# Patient Record
Sex: Female | Born: 1990 | Hispanic: Yes | Marital: Single | Smoking: Never smoker
Health system: Southern US, Community
[De-identification: ages and names within clinical notes are randomized; demographics above are authoritative.]

## PROBLEM LIST (undated history)

## (undated) DIAGNOSIS — E282 Polycystic ovarian syndrome: Secondary | ICD-10-CM

## (undated) HISTORY — DX: Polycystic ovarian syndrome: E28.2

---

## 2013-04-23 DIAGNOSIS — E282 Polycystic ovarian syndrome: Secondary | ICD-10-CM

## 2013-04-23 HISTORY — DX: Polycystic ovarian syndrome: E28.2

## 2015-10-13 ENCOUNTER — Ambulatory Visit (INDEPENDENT_AMBULATORY_CARE_PROVIDER_SITE_OTHER): Payer: BLUE CROSS/BLUE SHIELD | Admitting: Gynecology

## 2015-10-13 ENCOUNTER — Encounter: Payer: Self-pay | Admitting: Gynecology

## 2015-10-13 VITALS — BP 118/78 | Ht 62.0 in | Wt 162.0 lb

## 2015-10-13 DIAGNOSIS — N979 Female infertility, unspecified: Secondary | ICD-10-CM | POA: Diagnosis not present

## 2015-10-13 DIAGNOSIS — Z01419 Encounter for gynecological examination (general) (routine) without abnormal findings: Secondary | ICD-10-CM

## 2015-10-13 DIAGNOSIS — N912 Amenorrhea, unspecified: Secondary | ICD-10-CM

## 2015-10-13 LAB — PREGNANCY, URINE: Preg Test, Ur: NEGATIVE

## 2015-10-13 MED ORDER — MEDROXYPROGESTERONE ACETATE 10 MG PO TABS
10.0000 mg | ORAL_TABLET | Freq: Every day | ORAL | Status: AC
Start: 2015-10-13 — End: ?

## 2015-10-13 MED ORDER — DOXYCYCLINE HYCLATE 100 MG PO CAPS: 100.0000 mg | ORAL_CAPSULE | Freq: Two times a day (BID) | ORAL | Status: AC

## 2015-10-13 NOTE — Patient Instructions (Signed)
Espermograma (Semen Analysis Test) POR QU ME DEBO REALIZAR ESTE EXAMEN? El espermograma se [place] Islandsutiliza para Chief Operating Officercontrolar ciertos aspectos de la salud de los rganos reproductivos del hombre (testculos) y el sistema hormonal que cumple una funcin en la produccin de semen. El semen es una secrecin blanquecina que se expulsa desde el pene durante la fase final del orgasmo (eyaculacin). Est compuesto por lquidos y nutrientes de la prstata, las vesculas seminales y otras glndulas. Tambin contiene espermatozoides de los testculos. Un solo espermatozoide contiene una serie completa del cdigo gentico de un hombre (cromosomas). Este ARAMARK Corporationanlisis puede realizarse como parte de la prueba de esterilidad, que se efecta para ayudar a Chief Strategy Officerdeterminar las causas de la incapacidad de Uphamconcebir. Cuando el espermograma se realiza por esta causa, la prueba incluir la forma (morfologa), el tamao y el movimiento (movilidad) de los espermatozoides. En este anlisis tambin se puede incluir el examen de la capacidad de los espermatozoides de Customer service managerpenetrar en un vulo Camera operator(fertilizar) as Immunologistcomo la formacin del material gentico (ADN). Tambin puede realizarse un espermograma para determinar si una vasectoma realizada con anterioridad fue satisfactoria. Una vasectoma es un procedimiento que se realiza para hacer que un hombre sea estril de forma Cloverportpermanente. Consiste en la ligadura del conducto que recibe el semen del testculo, que se denomina conducto deferente. En una vasectoma, se impide que el semen atraviese el conducto deferente y el pene de modo que no se introduzca en la vagina durante la relacin sexual. QU TIPO DE MUESTRA SE TOMA? Para esta prueba, se extrae Lauris Poaguna muestra de semen. TENDR QUE RECOGER LAS MUESTRAS EN MI CASA? Se recolectar Lauris Poaguna muestra de semen al eyacular en un recipiente de vidrio o plstico estril provisto por el laboratorio. La prueba puede hacerse en el hogar, el consultorio del mdico o en el  laboratorio. Si la muestra se Engineer, productionrecolecta en el hogar, siga las instrucciones del mdico sobre cmo obtener la Chillicothemuestra. Quenton FetterLa muestra debe entregarse al laboratorio en el lapso de 1 hora despus de la recoleccin. Tambin debe protegerse del calor o el fro extremos. CMO DEBO PREPARARME PARA ESTA PRUEBA? Para la prueba de esterilidad: Evite la actividad sexual durante 2 a 3 das antes de la recoleccin de la Parrishmuestra de semen. Sin embargo, no evite Psychiatristla eyaculacin durante un perodo prolongado porque esto puede Special educational needs teacheralterar la movilidad de los espermatozoides. Para la prueba en la que se determina si la vasectoma fue satisfactoria: Asegrese de Civil Service fast streamereyacular una o dos veces antes del da de recoleccin de la Hyrummuestra de semen. Esto despejar el conducto deferente del semen que estuvo presente antes de la vasectoma. QU SON LOS VALORES DE REFERENCIA? Los valores de referencia son los valores saludables establecidos despus de Education officer, environmentalrealizar el anlisis a un grupo grande de personas sanas. Pueden variar American Family Insuranceentre diferentes personas, laboratorios y hospitales. Es su responsabilidad retirar el resultado del McLouthestudio. Consulte en el laboratorio o en el departamento en el que fue realizado el estudio cundo y cmo podr Starbucks Corporationobtener los resultados.   Volumen: de 2 a 5 ml.  Tiempo de licuefaccin: de 20 a 30 minutos despus de Financial traderla recoleccin.  Aspecto: normal (color blanquecino).  Movilidad/ml: mayor o igual que [id number] (10millones).  Semen/ml: mayor o igual que [id number] (20millones).  Viscosidad: mayor o igual que 3.  Aglutinacin: mayor o igual que 3.  Supravital: mayor o igual que el 75% vivos.  Fructosa: positivo.  pH: de 7,12 a 8.  Recuento de espermatozoides (densidad): mayor o igual que 2920millones/ml.  Movilidad espermtica: mayor o igual  que el 50% en Georgianne Fick.  Morfologa de los espermatozoides: mayor que el 30% (criterio de [place] mayor que el 14%) de formas normales. QU SIGNIFICAN LOS  RESULTADOS? El bajo nmero de espermatozoides, su movilidad o su morfologa anormales pueden causar problemas de esterilidad masculina. Cuando el espermograma se realiza para determinar si la vasectoma fue satisfactoria, la presencia de semen puede indicar que la ciruga no fue exitosa. El mdico puede sugerir que se repita la prueba. Hable con el mdico [employer], las opciones de tratamiento y, si es necesario, la necesidad de Education officer, environmental ms [place]. Hable con el mdico si tiene [employer].   Esta informacin no tiene Theme park manager el consejo del mdico. Asegrese de hacerle al mdico cualquier pregunta que tenga.   Document Released: 01/28/2013 Document Revised: 04/30/2014 Elsevier Interactive Patient Education 2016 [employer]. Histerosalpingografa (Hysterosalpingography) La histerosalpingografa es un procedimiento para observar el interior del tero y las trompas de [place]. Durante este procedimiento, se inyecta una sustancia de contraste en el tero, a travs de la vagina y el cuello del tero para poder visualizar el tero mientras se toman imgenes radiogrficas. Este procedimiento puede ayudar al mdico a diagnosticar tumores, adherencias o anormalidades estructurales en el tero. Generalmente, se [place] para determinar las razones por las que una mujer no puede tener hijos (infertilidad). El procedimiento suele durar entre 15 y 30 minutos. INFORME A SU MDICO:  Cualquier alergia que tenga.  Todos los [employer], incluidos vitaminas, hierbas, gotas oftlmicas, cremas y [address] de [address].  Problemas previos que usted o los [employer] de su familia hayan tenido con el uso de anestsicos.  Enfermedades de [employer].  Cirugas previas.  Afecciones mdicas que tenga. RIESGOS Y COMPLICACIONES  En general, se trata de un procedimiento seguro. Sin embargo, Photographer, pueden surgir problemas. Estos  son algunos posibles problemas:  Infeccin en el revestimiento interior del tero (endometritis) o en las trompas de [place] (salpingitis).  Dao o perforacin del tero o las trompas de [place].  Reaccin alrgica a la sustancia de [place] para tomar la radiografa. ANTES DEL PROCEDIMIENTO   Debe planificar el procedimiento para despus que finalice su perodo, pero antes de su prxima ovulacin. Esto ocurre por lo general [employer] 5 y 10 de su ltimo perodo. El Da 1 es Film/video editor de su perodo.  Consulte a su mdico si debe cambiar o suspender los medicamentos que toma habitualmente.  Podr comer y beber normalmente.  Antes de comenzar el procedimiento, vace la vejiga. PROCEDIMIENTO  Es posible que le administren un medicamento para Lexicographer (sedante) o un analgsico de venta libre para Paramedic las molestias durante el procedimiento.  Deber acostarse en una mesa de radiografas con los pies en los estribos.  Le colocarn en la vagina un dispositivo llamado espculo. Esto permite al mdico observar el interior de la vagina hasta el cuello del tero.  El cuello del tero se lava con un jabn especial.  Luego se pasa un tubo delgado y flexible a travs del cuello del tero hasta el tero.  Por el tubo se colocar una sustancia de [place].  Le tomarn varias radiografas a medida que el contraste pasa a travs del tero y las trompas de [place].  Despus del procedimiento se retira el tubo. DESPUS DEL PROCEDIMIENTO   La mayor parte de la sustancia de contraste se eliminar de la vagina naturalmente. Puede ser necesario que use un apsito sanitario.  Puede sentir clicos leves y notar una hemorragia vaginal leve. Luego de 24 horas deben desaparecer.  Pregunte cundo Hughes Supplyestarn listos los resultados. Asegrese de Starbucks Corporationobtener los resultados.   Esta informacin no tiene Theme park managercomo fin reemplazar el consejo del mdico. Asegrese de hacerle al mdico cualquier  pregunta que tenga.   Document Released: 07/02/2011 Document Revised: 04/14/2013 Elsevier Interactive Patient Education [employer]2016 Elsevier Inc.

## 2015-10-13 NOTE — Progress Notes (Signed)
Krystal OrganMireida Murphy 1991-02-08 [id number]   History:    25 y.o.  for annual gyn exam who is new to the practice. Patient is here not only for her annual exam but also with complaining of amenorrhea and primary infertility. She stated in [place] years ago she was placed on metformin for which she takes 500 mg 4 times a day. She denies any history of knowing that she has diabetes. She denied any visual disturbances, she denied any nipple discharge or any unusual headaches. She does weigh 162 pounds and her BMI is 29.63 kg/m. She states that she recently menarche at age 89. She denies any past history of any STDs or any pelvic surgery. Her partner has never had any children with any one else. They have been intimate and unable to get pregnant for 4 years. She states that sometimes she will go between 4-6 months without having a menstrual cycle. She reports her last menstrual period cycle March of this year.  Past medical history,surgical history, family history and social history were all reviewed and documented in the EPIC chart.  Gynecologic History Patient's last menstrual period was 06/29/2015. Contraception: none Last Pap: Several years ago. Results were: Patient reports it was normal  Last mammogram: Not indicated. Results were: Not indicated  Obstetric History OB History  Gravida Para Term Preterm AB SAB TAB Ectopic Multiple Living  0 0 0 0 0 0 0 0 0 0          ROS: A ROS was performed and pertinent positives and negatives are included in the history.  GENERAL: No fevers or chills. HEENT: No change in vision, no earache, sore throat or sinus congestion. NECK: No pain or stiffness. CARDIOVASCULAR: No chest pain or pressure. No palpitations. PULMONARY: No shortness of breath, cough or wheeze. GASTROINTESTINAL: No abdominal pain, nausea, vomiting or diarrhea, melena or bright red blood per rectum. GENITOURINARY: No urinary frequency, urgency, hesitancy or dysuria.  MUSCULOSKELETAL: No joint or muscle pain, no back pain, no recent trauma. DERMATOLOGIC: No rash, no itching, no lesions. ENDOCRINE: No polyuria, polydipsia, no heat or cold intolerance. No recent change in weight. HEMATOLOGICAL: No anemia or easy bruising or bleeding. NEUROLOGIC: No headache, seizures, numbness, tingling or weakness. PSYCHIATRIC: No depression, no loss of interest in normal activity or change in sleep pattern.     Exam: chaperone present  BP 118/78 mmHg  Ht 5\' 2"  (1.575 m)  Wt 162 lb (73.483 kg)  BMI 29.62 kg/m2  LMP 06/29/2015  Body mass index is 29.62 kg/(m^2).  General appearance : Well developed well nourished female. No acute distress HEENT: Eyes: no retinal hemorrhage or exudates,  Neck supple, trachea midline, no carotid bruits, no thyroidmegaly Lungs: Clear to auscultation, no rhonchi or wheezes, or rib retractions  Heart: Regular rate and rhythm, no murmurs or gallops Breast:Examined in sitting and supine position were symmetrical in appearance, no palpable masses or tenderness,  no skin retraction, no nipple inversion, no nipple discharge, no skin discoloration, no axillary or supraclavicular lymphadenopathy Abdomen: no palpable masses or tenderness, no rebound or guarding Extremities: no edema or skin discoloration or tenderness  Pelvic:  Bartholin, Urethra, Skene Glands: Within normal limits             Vagina: No gross lesions or discharge  Cervix: No gross lesions or discharge  Uterus  anteverted, normal size, shape and consistency, non-tender and mobile  Adnexa  Without masses or tenderness  Anus and perineum  normal   Rectovaginal  normal sphincter tone without palpated masses or tenderness             Hemoccult not indicated     Assessment/Plan:  25 y.o. female for annual exam which was normal. Patient with primary infertility/secondary amenorrhea. Urine (test negative today. As part of her evaluation we are going to check a TSH and prolactin today  along with the following screening blood work: Comprehensive metabolic panel, fasting lipid profile, CBC, and urinalysis. Pap smear with HPV screening was done today. Patient has never received the HPV vaccine series. She has she is attempting to get pregnant we will need to hold off on the vaccine for now. If the TSH and prolactin are normal patient will take Provera 10 mg 1 by mouth daily for 10 days to jump start her menses. She will then contact the office at that we can schedule an HSG as well as a semen analysis for her partner. When she has a day for the HSG she is going to be prescribed Vibramycin 100 mg to take 1 by mouth twice a day for 3 days starting the day before the HSG. All the above was written by and instructions provided to the patient. She was instructed to begin taking prenatal vitamins daily and we'll reassess if she needs to continue on the metformin base of the above tests.  15  additional minutes beside annual exam was undertaken to discuss fertility and testing.  Ok EdwardsFERNANDEZ,JUAN H MD, 11:25 AM 10/13/2015

## 2015-10-14 LAB — URINALYSIS W MICROSCOPIC + REFLEX CULTURE
Bilirubin Urine: NEGATIVE
CASTS: NONE SEEN [LPF]
CRYSTALS: NONE SEEN [HPF]
Glucose, UA: NEGATIVE
HGB URINE DIPSTICK: NEGATIVE
Ketones, ur: NEGATIVE
Leukocytes, UA: NEGATIVE
NITRITE: NEGATIVE
PROTEIN: NEGATIVE
Specific Gravity, Urine: 1.025 (ref 1.001–1.035)
WBC, UA: NONE SEEN WBC/HPF (ref <=5)
YEAST: NONE SEEN [HPF]
pH: 5.5 (ref 5.0–8.0)

## 2015-10-14 LAB — PAP IG W/ RFLX HPV ASCU

## 2015-10-15 LAB — URINE CULTURE: Colony Count: 40000

## 2015-11-02 ENCOUNTER — Telehealth: Payer: Self-pay | Admitting: *Deleted

## 2015-11-02 DIAGNOSIS — N979 Female infertility, unspecified: Secondary | ICD-10-CM

## 2015-11-02 NOTE — Telephone Encounter (Signed)
-----   Message from Jerilynn Mageslaudia Shaffer sent at 11/01/2015 12:44 PM EDT ----- Regarding: hsg Please schedule hsg for JF pt. LMP 11-01-15 and she prefers mornings.  Thx

## 2015-11-02 NOTE — Telephone Encounter (Signed)
[facility]  Pt aware to start doxycycline 100 mg day before procedure. No sex prior to procedure as well.

## 2015-11-07 ENCOUNTER — Ambulatory Visit (HOSPITAL_COMMUNITY)
Admission: RE | Admit: 2015-11-07 | Discharge: 2015-11-07 | Disposition: A | Discharge status: Home / Self Care | Payer: BLUE CROSS/BLUE SHIELD | Source: Ambulatory Visit | Attending: Gynecology | Admitting: Gynecology

## 2015-11-07 DIAGNOSIS — R938 Abnormal findings on diagnostic imaging of other specified body structures: Secondary | ICD-10-CM | POA: Diagnosis not present

## 2015-11-07 DIAGNOSIS — N979 Female infertility, unspecified: Secondary | ICD-10-CM | POA: Diagnosis present

## 2015-11-07 MED ORDER — IOPAMIDOL (ISOVUE-300) INJECTION 61%
30.0000 mL | Freq: Once | INTRAVENOUS | Status: AC | PRN
  Administered 2015-11-07: 30 mL

## 2016-09-05 ENCOUNTER — Encounter: Payer: Self-pay | Admitting: Gynecology

## 2017-11-27 IMAGING — RF DG HYSTEROGRAM
6 series · 6 of 6 positions shown · IV contrast (omnipaque)
Comparison: None.

CLINICAL DATA: Primary infertility.

EXAM:
HYSTEROSALPINGOGRAM
TECHNIQUE: Following cleansing of the cervix and vagina with Betadine solution,
a hysterosalpingogram was performed using a 5-French
hysterosalpingogram catheter and Omnipaque 300 contrast. The patient
tolerated the examination without difficulty.

[Series 1: run · 1 of 1 slices shown (1 of 6)]
[im 1/1]
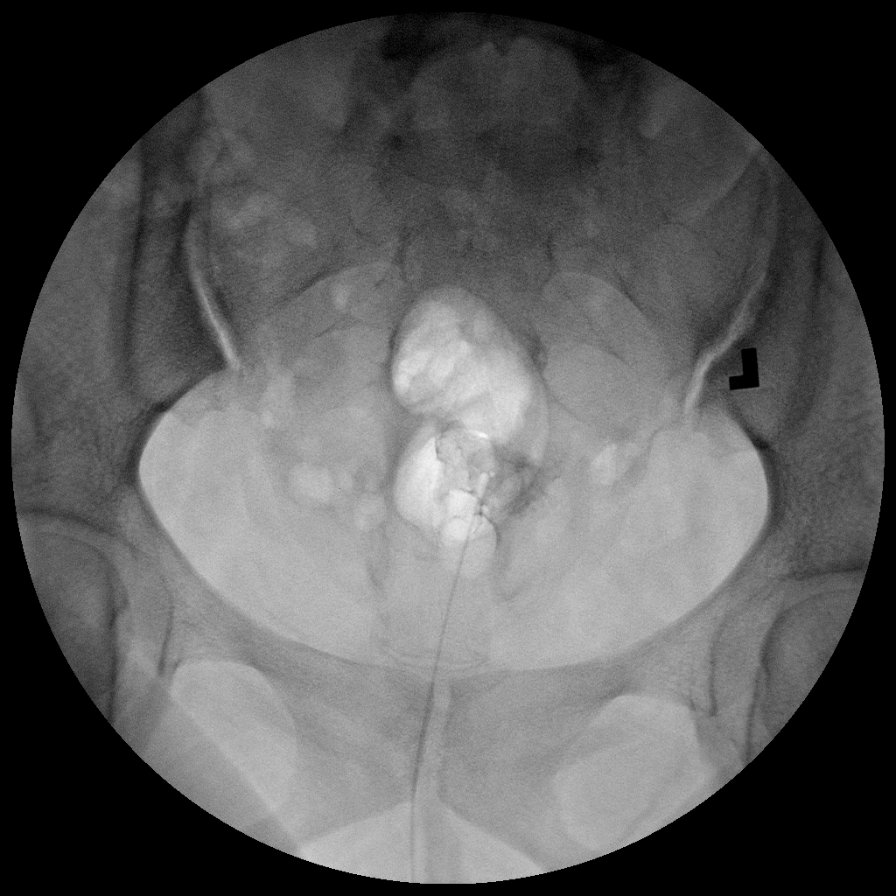

[Series 2: run · 1 of 1 slices shown (2 of 6)]
[im 1/1]
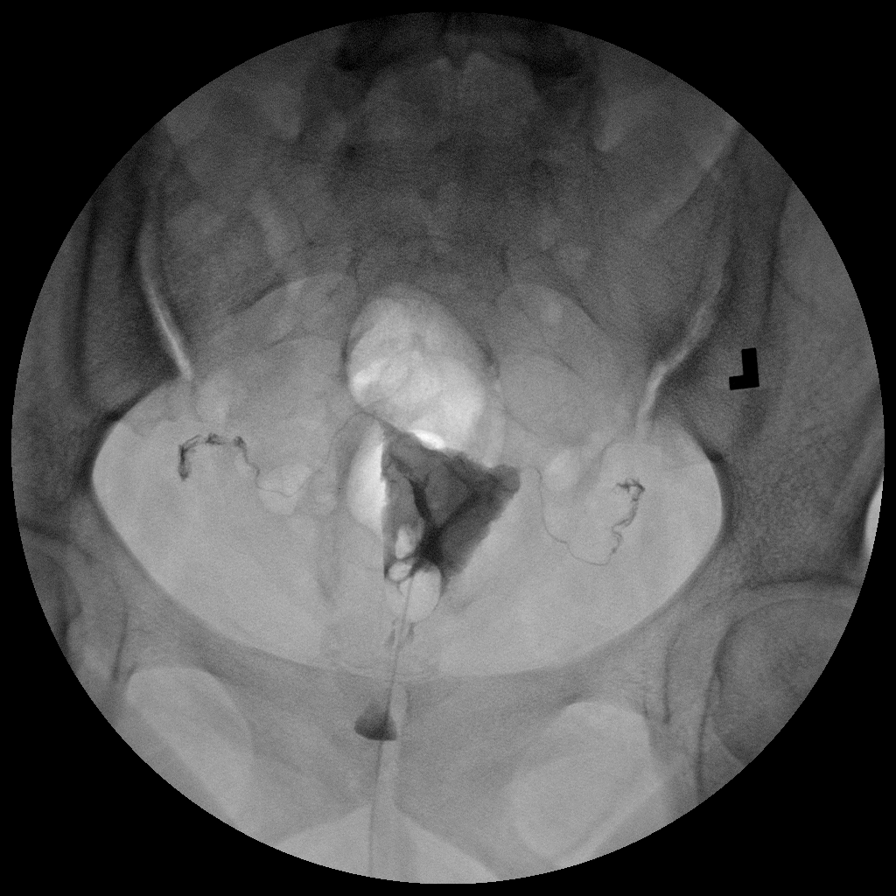

[Series 3: run · 1 of 1 slices shown (3 of 6)]
[im 1/1]
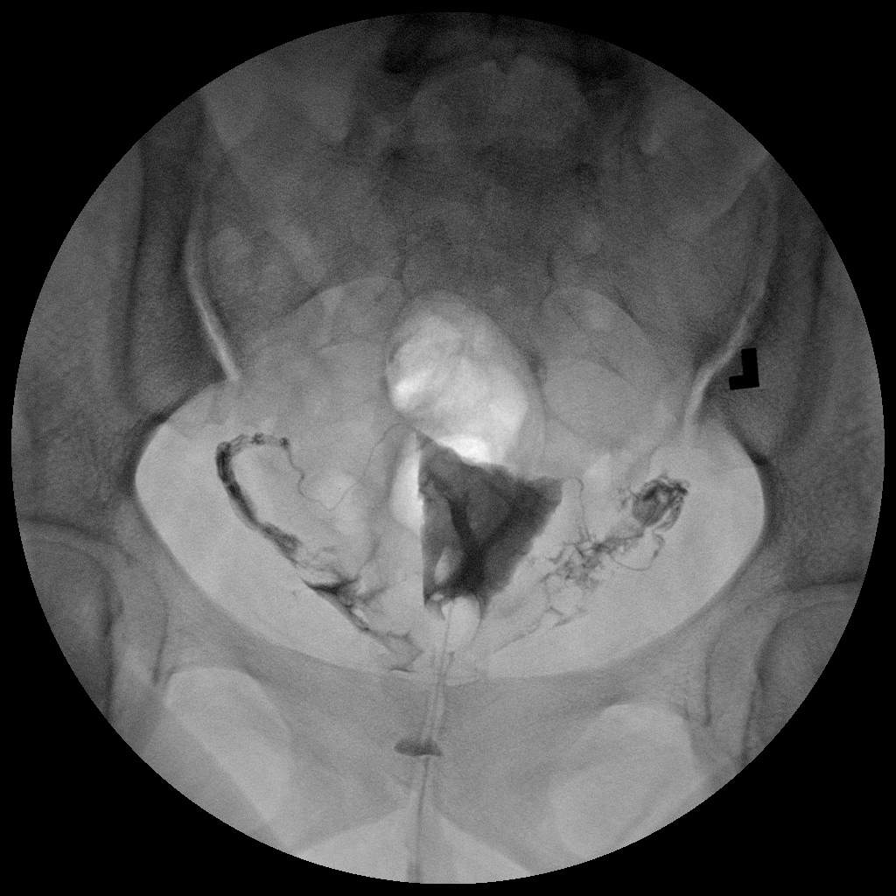

[Series 4: run · 1 of 1 slices shown (4 of 6)]
[im 1/1]
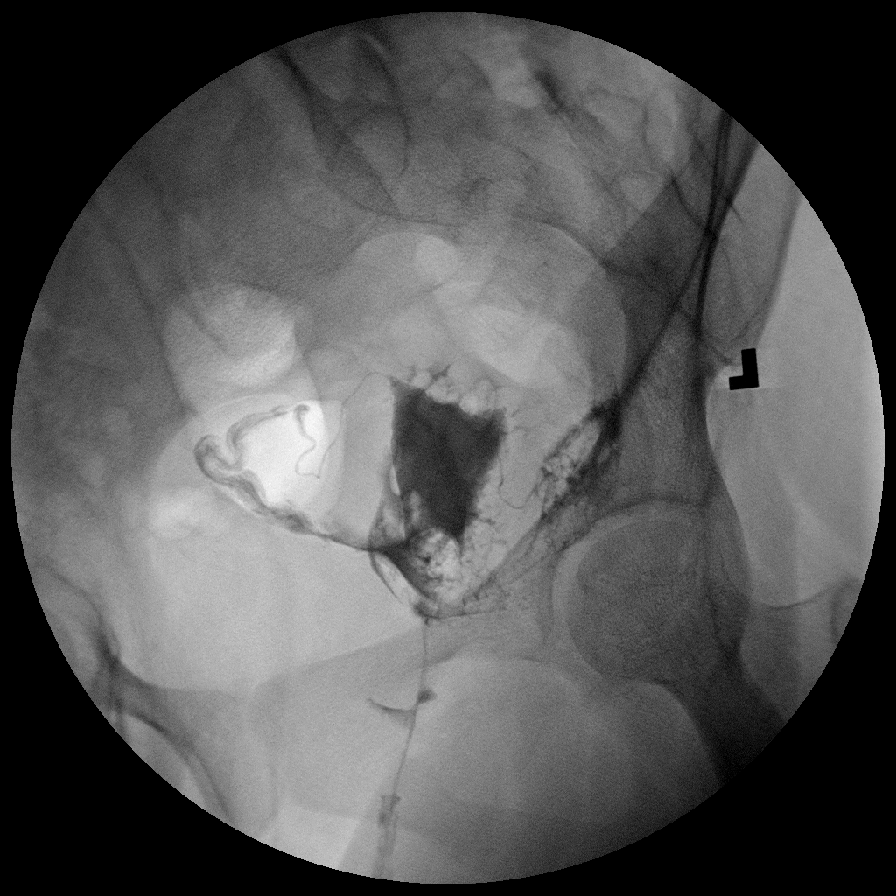

[Series 5: run · 1 of 1 slices shown (5 of 6)]
[im 1/1]
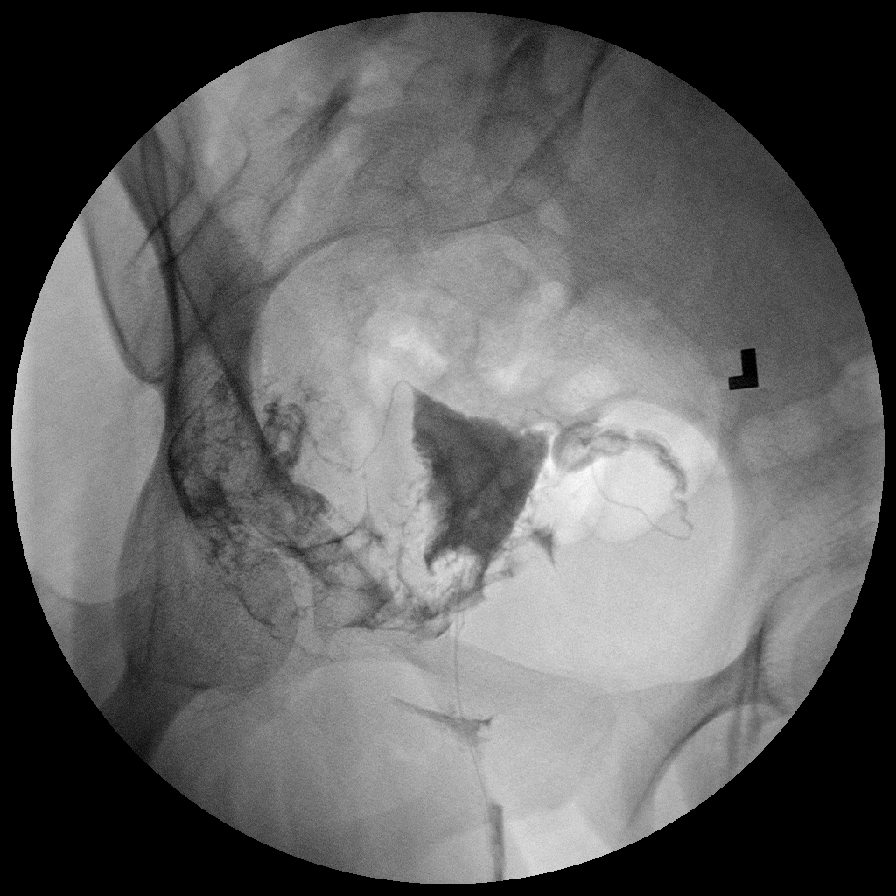

[Series 6: run · 1 of 1 slices shown (6 of 6)]
[im 1/1]
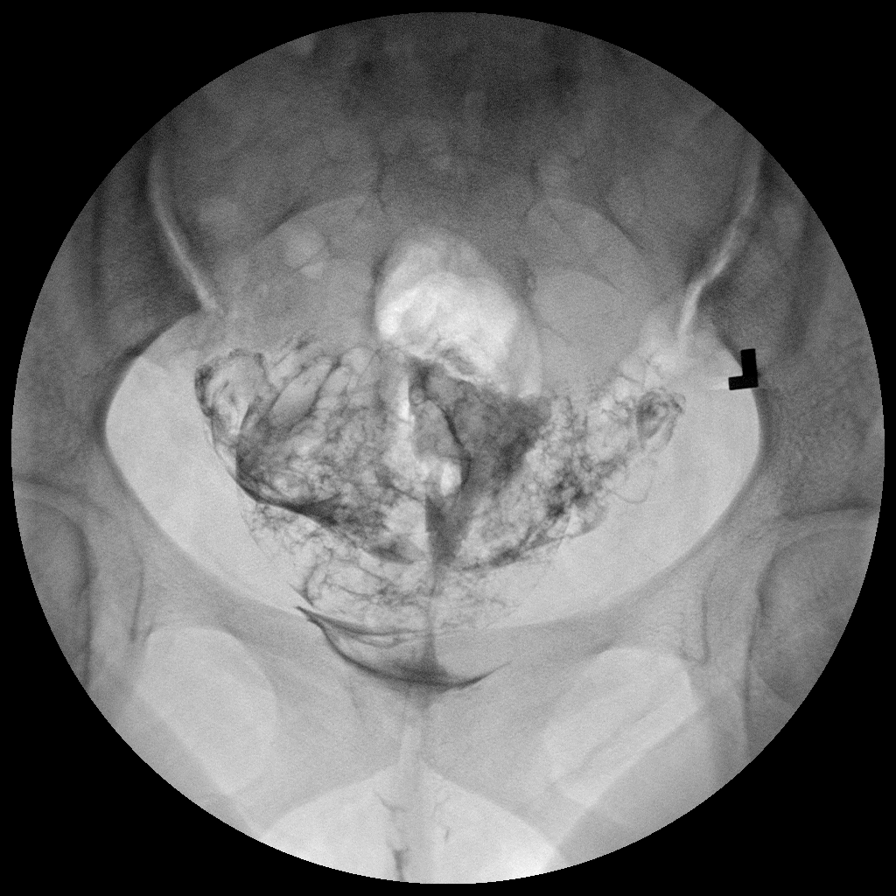

[6 of 6 positions shown; findings below may reference images not displayed]

FLUOROSCOPY TIME:  Fluoroscopy Time:  1 minutes 18 seconds.

Number of Acquired Images:  6
FINDINGS: There is a persistent slightly lobulated filling defect in the right
uterine cavity.

Both fallopian tubes are normal in caliber and appearance and
opacify promptly and in their entirety. There is spillage of
contrast from the fimbriated ends of both fallopian tubes into the
peritoneal cavity, with dispersal of contrast within the peritoneal
cavity bilaterally.
IMPRESSION: 1. Normal patent fallopian tubes bilaterally.
2. Persistent lobulated filling defect in the right uterine cavity,
suggestive of either a submucosal fibroid or endometrial polyp.
Recommend correlation with transabdominal and transvaginal pelvic
ultrasound and/or saline infusion sonohysterography.
